# Patient Record
Sex: Female | Born: 1975 | Race: White | Hispanic: No | Marital: Single | Smoking: Never smoker
Health system: Southern US, Community
[De-identification: ages and names within clinical notes are randomized; demographics above are authoritative.]

## PROBLEM LIST (undated history)

## (undated) DIAGNOSIS — J329 Chronic sinusitis, unspecified: Secondary | ICD-10-CM

## (undated) DIAGNOSIS — B029 Zoster without complications: Secondary | ICD-10-CM

## (undated) HISTORY — PX: BREAST ENHANCEMENT SURGERY: SHX7

## (undated) HISTORY — PX: OTHER SURGICAL HISTORY: SHX169

---

## 1999-08-13 ENCOUNTER — Ambulatory Visit (HOSPITAL_COMMUNITY): Admission: RE | Admit: 1999-08-13 | Discharge: 1999-08-13 | Payer: Self-pay | Admitting: Obstetrics and Gynecology

## 1999-12-23 ENCOUNTER — Other Ambulatory Visit: Admission: RE | Admit: 1999-12-23 | Discharge: 1999-12-23 | Payer: Self-pay | Admitting: Obstetrics and Gynecology

## 2001-03-16 ENCOUNTER — Other Ambulatory Visit: Admission: RE | Admit: 2001-03-16 | Discharge: 2001-03-16 | Payer: Self-pay | Admitting: Obstetrics and Gynecology

## 2002-03-17 ENCOUNTER — Other Ambulatory Visit: Admission: RE | Admit: 2002-03-17 | Discharge: 2002-03-17 | Payer: Self-pay | Admitting: Obstetrics and Gynecology

## 2003-08-29 ENCOUNTER — Other Ambulatory Visit: Admission: RE | Admit: 2003-08-29 | Discharge: 2003-08-29 | Payer: Self-pay | Admitting: Obstetrics and Gynecology

## 2005-10-05 ENCOUNTER — Inpatient Hospital Stay (HOSPITAL_COMMUNITY): Admission: AD | Admit: 2005-10-05 | Discharge: 2005-10-09 | Payer: Self-pay | Admitting: Obstetrics and Gynecology

## 2005-10-10 ENCOUNTER — Encounter: Admission: RE | Admit: 2005-10-10 | Discharge: 2005-11-06 | Payer: Self-pay | Admitting: Obstetrics and Gynecology

## 2005-11-07 ENCOUNTER — Encounter: Admission: RE | Admit: 2005-11-07 | Discharge: 2005-12-07 | Payer: Self-pay | Admitting: Obstetrics and Gynecology

## 2005-12-08 ENCOUNTER — Encounter: Admission: RE | Admit: 2005-12-08 | Discharge: 2006-01-06 | Payer: Self-pay | Admitting: Obstetrics and Gynecology

## 2006-01-07 ENCOUNTER — Encounter: Admission: RE | Admit: 2006-01-07 | Discharge: 2006-02-06 | Payer: Self-pay | Admitting: Obstetrics and Gynecology

## 2006-02-07 ENCOUNTER — Encounter: Admission: RE | Admit: 2006-02-07 | Discharge: 2006-03-08 | Payer: Self-pay | Admitting: Obstetrics and Gynecology

## 2006-03-09 ENCOUNTER — Encounter: Admission: RE | Admit: 2006-03-09 | Discharge: 2006-04-08 | Payer: Self-pay | Admitting: Obstetrics and Gynecology

## 2006-04-09 ENCOUNTER — Encounter: Admission: RE | Admit: 2006-04-09 | Discharge: 2006-04-13 | Payer: Self-pay | Admitting: Obstetrics and Gynecology

## 2007-03-23 ENCOUNTER — Ambulatory Visit: Payer: Self-pay | Admitting: Family Medicine

## 2012-09-12 ENCOUNTER — Emergency Department (HOSPITAL_BASED_OUTPATIENT_CLINIC_OR_DEPARTMENT_OTHER)
Admission: EM | Admit: 2012-09-12 | Discharge: 2012-09-12 | Disposition: A | Discharge status: Home / Self Care | Payer: BC Managed Care – PPO | Attending: Emergency Medicine | Admitting: Emergency Medicine

## 2012-09-12 ENCOUNTER — Emergency Department (HOSPITAL_BASED_OUTPATIENT_CLINIC_OR_DEPARTMENT_OTHER): Payer: BC Managed Care – PPO

## 2012-09-12 ENCOUNTER — Encounter (HOSPITAL_BASED_OUTPATIENT_CLINIC_OR_DEPARTMENT_OTHER): Payer: Self-pay

## 2012-09-12 DIAGNOSIS — R51 Headache: Secondary | ICD-10-CM | POA: Insufficient documentation

## 2012-09-12 DIAGNOSIS — Z792 Long term (current) use of antibiotics: Secondary | ICD-10-CM | POA: Insufficient documentation

## 2012-09-12 DIAGNOSIS — H9209 Otalgia, unspecified ear: Secondary | ICD-10-CM

## 2012-09-12 DIAGNOSIS — J329 Chronic sinusitis, unspecified: Secondary | ICD-10-CM | POA: Insufficient documentation

## 2012-09-12 DIAGNOSIS — Z79899 Other long term (current) drug therapy: Secondary | ICD-10-CM | POA: Insufficient documentation

## 2012-09-12 DIAGNOSIS — R1110 Vomiting, unspecified: Secondary | ICD-10-CM | POA: Insufficient documentation

## 2012-09-12 DIAGNOSIS — F17200 Nicotine dependence, unspecified, uncomplicated: Secondary | ICD-10-CM | POA: Insufficient documentation

## 2012-09-12 DIAGNOSIS — Z3202 Encounter for pregnancy test, result negative: Secondary | ICD-10-CM | POA: Insufficient documentation

## 2012-09-12 HISTORY — DX: Chronic sinusitis, unspecified: J32.9

## 2012-09-12 LAB — PREGNANCY, URINE: Preg Test, Ur: NEGATIVE

## 2012-09-12 MED ORDER — METOCLOPRAMIDE HCL 5 MG/ML IJ SOLN
10.0000 mg | Freq: Once | INTRAMUSCULAR | Status: AC
  Administered 2012-09-12: 10 mg via INTRAMUSCULAR
  Filled 2012-09-12: qty 2

## 2012-09-12 MED ORDER — NAPROXEN 375 MG PO TABS: 375.0000 mg | ORAL_TABLET | Freq: Two times a day (BID) | ORAL | Status: AC

## 2012-09-12 MED ORDER — ANTIPYRINE-BENZOCAINE 5.4-1.4 % OT SOLN: 3.0000 [drp] | Freq: Four times a day (QID) | OTIC | Status: AC | PRN

## 2012-09-12 MED ORDER — DIPHENHYDRAMINE HCL 50 MG/ML IJ SOLN
12.5000 mg | Freq: Once | INTRAMUSCULAR | Status: AC
  Administered 2012-09-12: 12.5 mg via INTRAMUSCULAR
  Filled 2012-09-12: qty 1

## 2012-09-12 MED ORDER — KETOROLAC TROMETHAMINE 60 MG/2ML IM SOLN
60.0000 mg | Freq: Once | INTRAMUSCULAR | Status: AC
  Administered 2012-09-12: 60 mg via INTRAMUSCULAR
  Filled 2012-09-12: qty 2

## 2012-09-12 NOTE — ED Notes (Signed)
Pt back from CT

## 2012-09-12 NOTE — ED Notes (Signed)
Patient here with ongoing frontal headache that is worse tonight. Has been taking 2 different antibiotics and steroid for sinus infection. Reports vomiting x 1 tonight due to the pain. Taking amoxicillin now

## 2012-09-12 NOTE — ED Provider Notes (Signed)
History     CSN: [id number]  Arrival date & time 09/12/12  0148   First MD Initiated Contact with Patient 09/12/12 [phone]      Chief Complaint  Patient presents with  . frontal headache, earache     (Consider location/radiation/quality/duration/timing/severity/associated sxs/prior treatment) Patient is a 37 y.o. female presenting with ear pain and headaches. The history is provided by the patient. No language interpreter was used.  Otalgia This is a new problem. The current episode started more than 1 week ago. There is pain in both ears. The problem occurs constantly. The problem has not changed since onset.There has been no fever. The pain is severe. Associated symptoms include headaches. Pertinent negatives include no ear discharge, no hearing loss, no rhinorrhea, no sore throat, no neck pain, no cough and no rash. Her past medical history does not include chronic ear infection.  Headache  This is a new problem. The current episode started 6 to 12 hours ago. The problem occurs constantly. The problem has not changed since onset.The headache is associated with nothing. The pain is located in the frontal region. The quality of the pain is described as dull. The pain does not radiate. Pertinent negatives include no anorexia, no fever, no malaise/fatigue, no chest pressure and no shortness of breath. She has tried nothing for the symptoms. The treatment provided no relief.  No f/c/r.  No changes in vision no changes in cognition  Past Medical History  Diagnosis Date  . Sinusitis     History reviewed. No pertinent past surgical history.  No family history on file.  History  Substance Use Topics  . Smoking status: Current Every Day Smoker -- 0.5 packs/day    Types: Cigarettes  . Smokeless tobacco: Not on file  . Alcohol Use:     OB History    Grav Para Term Preterm Abortions TAB SAB Ect Mult Living                  Review of Systems  Constitutional: Negative for fever and  malaise/fatigue.  HENT: Positive for ear pain and sinus pressure. Negative for hearing loss, sore throat, rhinorrhea, neck pain and ear discharge.   Respiratory: Negative for cough and shortness of breath.   Gastrointestinal: Negative for anorexia.  Skin: Negative for rash.  Neurological: Positive for headaches.  All other systems reviewed and are negative.    Allergies  Review of patient's allergies indicates no known allergies.  Home Medications   Current Outpatient Rx  Name  Route  Sig  Dispense  Refill  . AMOXICILLIN 875 MG PO TABS   Oral   Take 875 mg by mouth 2 (two) times daily.         . AMPHETAMINE-DEXTROAMPHETAMINE 20 MG PO TABS   Oral   Take 20 mg by mouth 2 (two) times daily.           BP 153/99  Pulse 94  Temp 97.5 F (36.4 C) (Oral)  Resp 24  Ht 5' 5.5" (1.664 m)  Wt 168 lb (76.204 kg)  BMI 27.53 kg/m2  SpO2 98%  Physical Exam  Constitutional: She is oriented to person, place, and time. She appears well-developed and well-nourished. No distress.  HENT:  Head: Normocephalic and atraumatic.  Right Ear: No mastoid tenderness. Tympanic membrane is not injected, not erythematous and not bulging.  Left Ear: No mastoid tenderness. Tympanic membrane is not injected, not erythematous and not bulging.  Nose: Mucosal edema present.  Mouth/Throat: No oropharyngeal  exudate.       Posterior oropharyngeal cobblestoning and clear drainage consistent with post nasal drip  No temporal tenderness  Eyes: Conjunctivae normal and EOM are normal. Pupils are equal, round, and reactive to light.  Neck: Normal range of motion. Neck supple. No tracheal deviation present.       No meningeal signs  Cardiovascular: Normal rate and regular rhythm.   Pulmonary/Chest: Effort normal and breath sounds normal. She has no wheezes. She has no rales.  Abdominal: Soft. Bowel sounds are normal. There is no tenderness. There is no rebound and no guarding.  Musculoskeletal: Normal range  of motion.  Lymphadenopathy:    She has no cervical adenopathy.  Neurological: She is alert and oriented to person, place, and time. She has normal reflexes. No cranial nerve deficit.  Skin: Skin is warm and dry.  Psychiatric: Her mood appears anxious.    ED Course  Procedures (including critical care time)   Labs Reviewed  PREGNANCY, URINE   Ct Head Wo Contrast  09/12/2012  *RADIOLOGY REPORT*  Clinical Data: Headache  CT HEAD WITHOUT CONTRAST  Technique:  Contiguous axial images were obtained from the base of the skull through the vertex without contrast.  Comparison: None.  Findings: There is no evidence for acute hemorrhage, hydrocephalus, mass lesion, or abnormal extra-axial fluid collection.  No definite CT evidence for acute infarction.  The visualized paranasal sinuses and mastoid air cells are predominately clear.  IMPRESSION: No acute intracranial abnormality.   Original Report Authenticated By: Jearld Lesch, M.D.      No diagnosis found.    MDM  Complete antibiotics.  Will prescribe ear drops.  Return for fever, stiff neck, changes in thinking, speech, weakness numbness or any concerns       Oceane Fosse K Quron Ruddy-Rasch, MD 09/12/12 [phone]

## 2015-02-07 ENCOUNTER — Emergency Department (HOSPITAL_BASED_OUTPATIENT_CLINIC_OR_DEPARTMENT_OTHER): Payer: BLUE CROSS/BLUE SHIELD

## 2015-02-07 ENCOUNTER — Encounter (HOSPITAL_BASED_OUTPATIENT_CLINIC_OR_DEPARTMENT_OTHER): Payer: Self-pay

## 2015-02-07 ENCOUNTER — Emergency Department (HOSPITAL_BASED_OUTPATIENT_CLINIC_OR_DEPARTMENT_OTHER)
Admission: EM | Admit: 2015-02-07 | Discharge: 2015-02-07 | Disposition: A | Discharge status: Home / Self Care | Payer: BLUE CROSS/BLUE SHIELD | Attending: Emergency Medicine | Admitting: Emergency Medicine

## 2015-02-07 DIAGNOSIS — Z79899 Other long term (current) drug therapy: Secondary | ICD-10-CM | POA: Insufficient documentation

## 2015-02-07 DIAGNOSIS — Z792 Long term (current) use of antibiotics: Secondary | ICD-10-CM | POA: Insufficient documentation

## 2015-02-07 DIAGNOSIS — R110 Nausea: Secondary | ICD-10-CM | POA: Diagnosis not present

## 2015-02-07 DIAGNOSIS — H538 Other visual disturbances: Secondary | ICD-10-CM | POA: Insufficient documentation

## 2015-02-07 DIAGNOSIS — Z8709 Personal history of other diseases of the respiratory system: Secondary | ICD-10-CM | POA: Diagnosis not present

## 2015-02-07 DIAGNOSIS — R51 Headache: Secondary | ICD-10-CM | POA: Insufficient documentation

## 2015-02-07 DIAGNOSIS — Z791 Long term (current) use of non-steroidal anti-inflammatories (NSAID): Secondary | ICD-10-CM | POA: Diagnosis not present

## 2015-02-07 DIAGNOSIS — R519 Headache, unspecified: Secondary | ICD-10-CM

## 2015-02-07 DIAGNOSIS — Z8619 Personal history of other infectious and parasitic diseases: Secondary | ICD-10-CM | POA: Insufficient documentation

## 2015-02-07 HISTORY — DX: Zoster without complications: B02.9

## 2015-02-07 MED ORDER — METOCLOPRAMIDE HCL 5 MG/ML IJ SOLN
10.0000 mg | Freq: Once | INTRAMUSCULAR | Status: AC
  Administered 2015-02-07: 10 mg via INTRAVENOUS
  Filled 2015-02-07: qty 2

## 2015-02-07 MED ORDER — DIPHENHYDRAMINE HCL 50 MG/ML IJ SOLN
25.0000 mg | Freq: Once | INTRAMUSCULAR | Status: AC
  Administered 2015-02-07: 25 mg via INTRAVENOUS
  Filled 2015-02-07: qty 1

## 2015-02-07 MED ORDER — SODIUM CHLORIDE 0.9 % IV BOLUS (SEPSIS)
1000.0000 mL | Freq: Once | INTRAVENOUS | Status: AC
  Administered 2015-02-07: 1000 mL via INTRAVENOUS

## 2015-02-07 MED ORDER — KETOROLAC TROMETHAMINE 30 MG/ML IJ SOLN
30.0000 mg | Freq: Once | INTRAMUSCULAR | Status: AC
  Administered 2015-02-07: 30 mg via INTRAVENOUS
  Filled 2015-02-07: qty 1

## 2015-02-07 NOTE — ED Notes (Signed)
PIV insert by RRT

## 2015-02-07 NOTE — ED Provider Notes (Signed)
CSN: [id number]     Arrival date & time 02/07/15  1694 History   First MD Initiated Contact with Patient 02/07/15 [phone]     Chief Complaint  Patient presents with  . Headache     (Consider location/radiation/quality/duration/timing/severity/associated sxs/prior Treatment) HPI  This is a 39 year old female with a one-month history of headaches. The headaches are episodic and typically occur at night. They occur almost every night. They are located in the frontal area. They usually improve one hour after taking ibuprofen. She developed a headache last night about 9 PM which did not improve with ibuprofen. She describes the pain as severe and feels like there is a hot poker above her left eye. She was seen for this in [place] almost a month ago and had sinus x-rays which were reportedly unremarkable. She was seen 5 days ago for a rash on her right lower back and was diagnosed with shingles for which she is taking valacyclovir and gabapentin. She complains of blurred vision in her left eye and nausea as associated symptoms. There is no focal neurologic deficit. There is no photophobia.  Past Medical History  Diagnosis Date  . Sinusitis   . Shingles    Past Surgical History  Procedure Laterality Date  . Breast enhancement surgery    . Tummy tuck    . Cesarean section     No family history on file. History  Substance Use Topics  . Smoking status: Never Smoker   . Smokeless tobacco: Not on file  . Alcohol Use: Yes   OB History    No data available     Review of Systems  All other systems reviewed and are negative.   Allergies  Review of patient's allergies indicates no known allergies.  Home Medications   Prior to Admission medications   Medication Sig Start Date End Date Taking? Authorizing Provider  amoxicillin (AMOXIL) 875 MG tablet Take 875 mg by mouth 2 (two) times daily.    Historical Provider, MD  amphetamine-dextroamphetamine (ADDERALL) 20 MG tablet Take 20 mg by mouth  2 (two) times daily.    Historical Provider, MD  antipyrine-benzocaine Lyla Son) otic solution Place 3 drops in ear(s) 4 (four) times daily as needed for pain. 09/12/12   April Palumbo, MD  naproxen (NAPROSYN) 375 MG tablet Take 1 tablet (375 mg total) by mouth 2 (two) times daily. 09/12/12   April Palumbo, MD   BP 140/92 mmHg  Pulse 70  Temp(Src) 97.9 F (36.6 C) (Oral)  Resp 20  Ht 5\' 5"  (1.651 m)  Wt 164 lb (74.39 kg)  BMI 27.29 kg/m2  SpO2 98%   Physical Exam  General: Well-developed, well-nourished female in no acute distress; appearance consistent with age of record HENT: normocephalic; atraumatic Eyes: pupils equal, round and reactive to light; extraocular muscles intact; left conjunctival injection Neck: supple Heart: regular rate and rhythm Lungs: clear to auscultation bilaterally Abdomen: soft; nondistended Extremities: No deformity; full range of motion; pulses normal Neurologic: Awake, alert and oriented; motor function intact in all extremities and symmetric; no facial droop Skin: Warm and dry; crusted lesions right lower back consistent with healing shingles Psychiatric: Tearful    ED Course  Procedures (including critical care time)   MDM  Nursing notes and vitals signs, including pulse oximetry, reviewed.  Summary of this visit's results, reviewed by myself:  Imaging Studies: Ct Head Wo Contrast  02/07/2015   CLINICAL DATA:  Initial evaluation for 4 week history of severe headache with nausea.  EXAM:  CT HEAD WITHOUT CONTRAST  TECHNIQUE: Contiguous axial images were obtained from the base of the skull through the vertex without intravenous contrast.  COMPARISON:  Prior study from 09/12/2012.  FINDINGS: There is no acute intracranial hemorrhage or infarct. No mass lesion or midline shift. Gray-white matter differentiation is well maintained. Ventricles are normal in size without evidence of hydrocephalus. CSF containing spaces are within normal limits. No  extra-axial fluid collection.  The calvarium is intact.  Orbital soft tissues are within normal limits.  Scattered mucosal thickening present within the ethmoidal sinuses, with small amount of layering opacity within the frontoethmoidal recesses bilaterally. Maxillary and sphenoid sinuses are well pneumatized. No osseous dehiscence. No air-fluid level to suggest active sinus infection. Mastoid air cells are clear.  Scalp soft tissues are unremarkable.  IMPRESSION: 1. No acute intracranial process identified. 2. Mild ethmoidal sinus disease as above.   Electronically Signed   By: Rise Mu M.D.   On: 02/07/2015 07:11      Paula Libra, MD 02/07/15 [phone]

## 2015-02-07 NOTE — ED Notes (Signed)
Pt c/o severe headache x4wks with nausea, states dx with shingles to back on saturday

## 2015-02-07 NOTE — Discharge Instructions (Signed)
Continue ibuprofen and Tylenol as needed for headache.  If your headaches persist, follow-up with your primary Dr. to discuss further treatment or referral to a neurologist.   General Headache Without Cause A general headache is pain or discomfort felt around the head or neck area. The cause may not be found.  HOME CARE   Keep all doctor visits.  Only take medicines as told by your doctor.  Lie down in a dark, quiet room when you have a headache.  Keep a journal to find out if certain things bring on headaches. For example, write down:  What you eat and drink.  How much sleep you get.  Any change to your diet or medicines.  Relax by getting a massage or doing other relaxing activities.  Put ice or heat packs on the head and neck area as told by your doctor.  Lessen stress.  Sit up straight. Do not tighten (tense) your muscles.  Quit smoking if you smoke.  Lessen how much alcohol you drink.  Lessen how much caffeine you drink, or stop drinking caffeine.  Eat and sleep on a regular schedule.  Get 7 to 9 hours of sleep, or as told by your doctor.  Keep lights dim if bright lights bother you or make your headaches worse. GET HELP RIGHT AWAY IF:   Your headache becomes really bad.  You have a fever.  You have a stiff neck.  You have trouble seeing.  Your muscles are weak, or you lose muscle control.  You lose your balance or have trouble walking.  You feel like you will pass out (faint), or you pass out.  You have really bad symptoms that are different than your first symptoms.  You have problems with the medicines given to you by your doctor.  Your medicines do not work.  Your headache feels different than the other headaches.  You feel sick to your stomach (nauseous) or throw up (vomit). MAKE SURE YOU:   Understand these instructions.  Will watch your condition.  Will get help right away if you are not doing well or get worse. Document Released:  05/12/2008 Document Revised: 10/26/2011 Document Reviewed: 07/24/2011 [facility] Patient Information 2015 [place], [place]. This information is not intended to replace advice given to you by your health care provider. Make sure you discuss any questions you have with your health care provider.

## 2015-02-07 NOTE — Progress Notes (Signed)
Placed patient on 100% non rebreather per MD

## 2015-02-07 NOTE — ED Provider Notes (Signed)
Care assumed from Dr. Read Drivers at shift change. Patient presents here with complaints of headache. Neurologic exam is nonfocal and CT scan of the head is negative. She is feeling better after IV medications and fluids and I believe is appropriate for discharge. She is to follow-up with her primary Dr. She has had an increased frequency of headaches over the past month. If this continues she may require referral to neurology. This can be arranged in a nonemergent outpatient fashion for her primary doctor.  Geoffery Lyons, MD 02/07/15 [phone]

## 2015-06-04 ENCOUNTER — Other Ambulatory Visit: Payer: Self-pay | Admitting: Obstetrics and Gynecology

## 2015-06-04 DIAGNOSIS — R928 Other abnormal and inconclusive findings on diagnostic imaging of breast: Secondary | ICD-10-CM

## 2015-06-07 ENCOUNTER — Ambulatory Visit
Admission: RE | Admit: 2015-06-07 | Discharge: 2015-06-07 | Disposition: A | Discharge status: Home / Self Care | Payer: BLUE CROSS/BLUE SHIELD | Source: Ambulatory Visit | Attending: Obstetrics and Gynecology | Admitting: Obstetrics and Gynecology

## 2015-06-07 DIAGNOSIS — R928 Other abnormal and inconclusive findings on diagnostic imaging of breast: Secondary | ICD-10-CM

## 2016-06-06 IMAGING — MG MM DIAG BREAST TOMO UNI RIGHT
6 series · 6 of 18 positions shown · non-contrast
Comparison: Baseline screening exam dated 05/30/2015.

CLINICAL DATA: Screening recall for a right breast asymmetry.

EXAM:
DIGITAL DIAGNOSTIC RIGHT MAMMOGRAM WITH 3D TOMOSYNTHESIS AND CAD

[R ML]
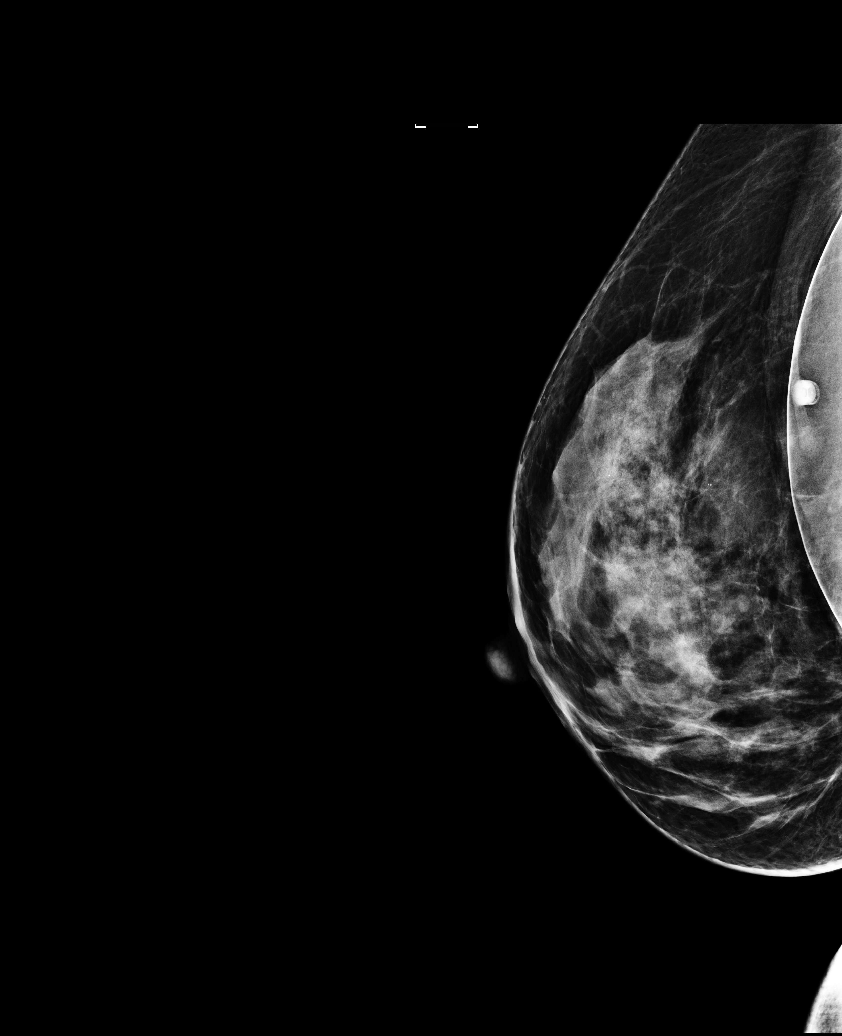

[R MLO (1 of 2)]
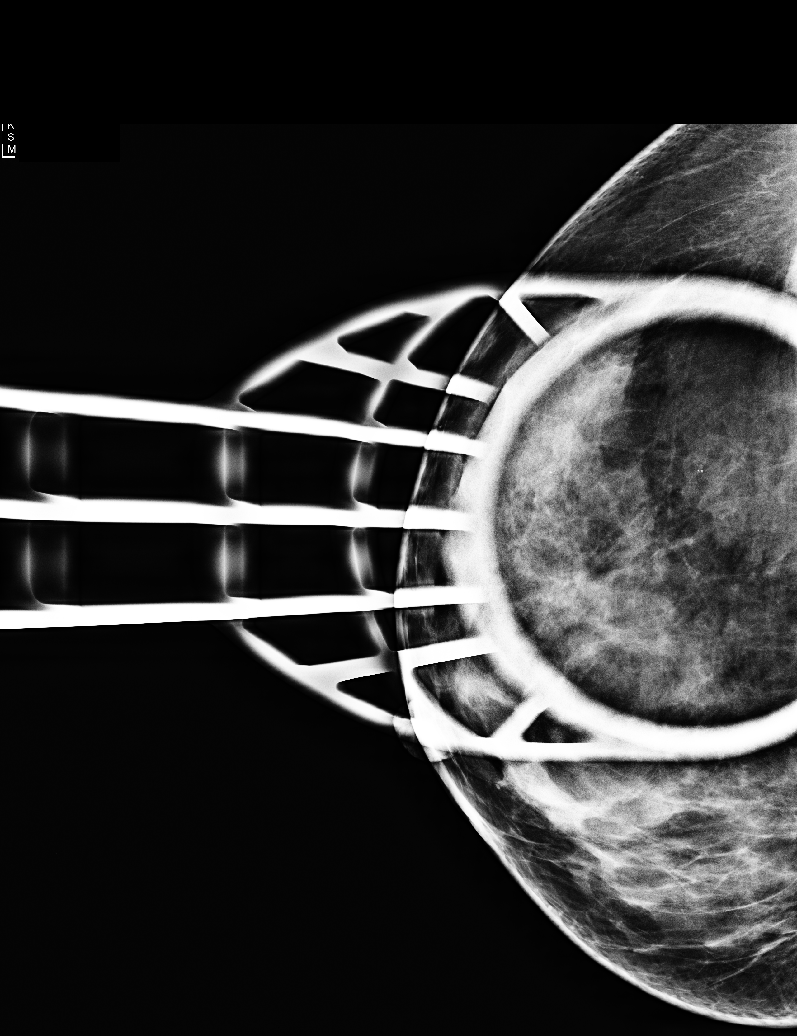

[R MLO (2 of 2)]
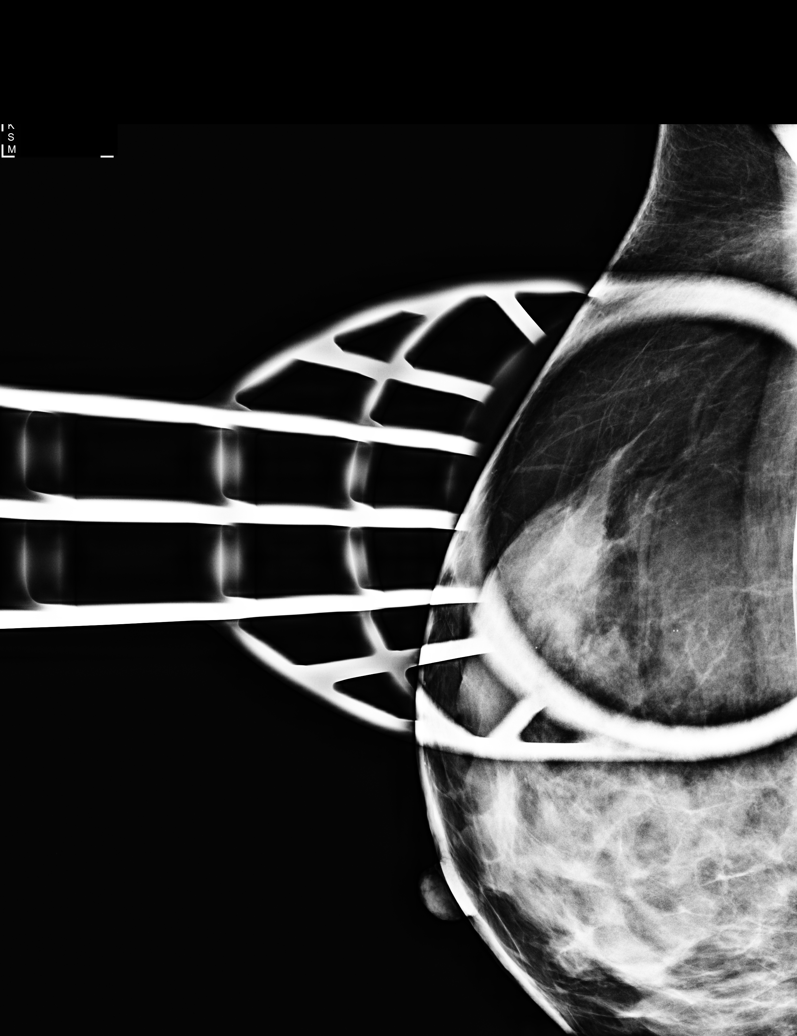

[R ML tomo · tomo slice 34/67.0]
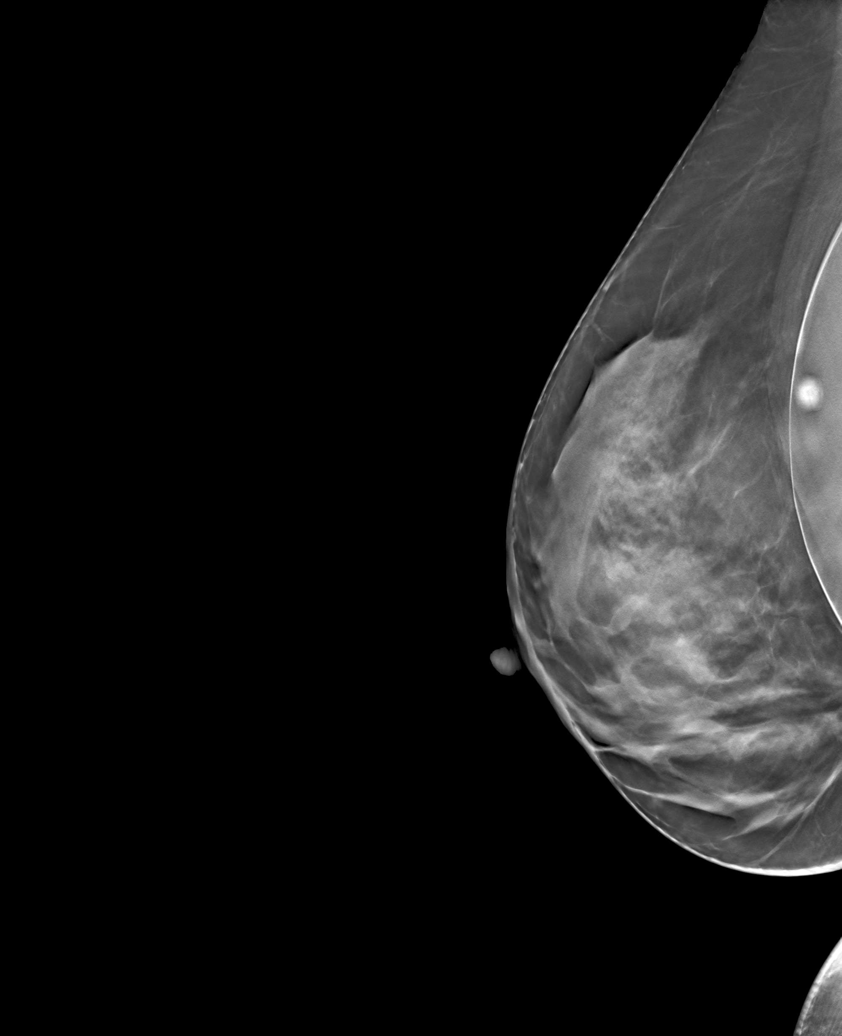

[R MLO tomo (1 of 2) · tomo slice 29/56.0]
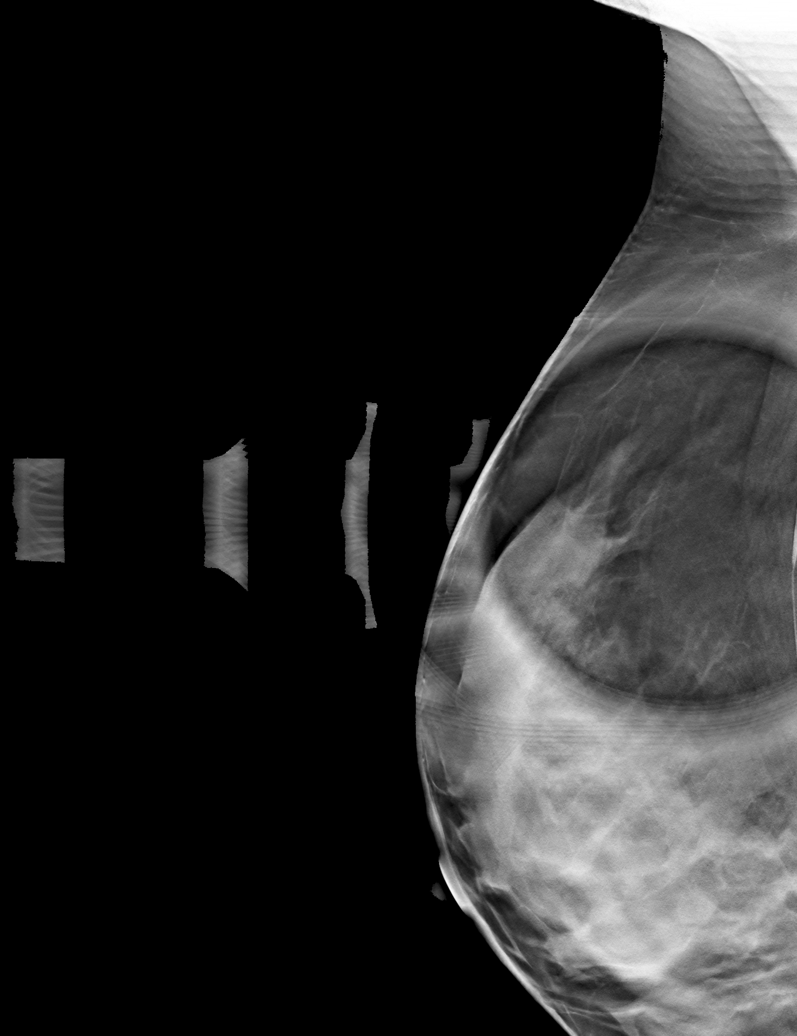

[R MLO tomo (2 of 2) · tomo slice 27/54.0]
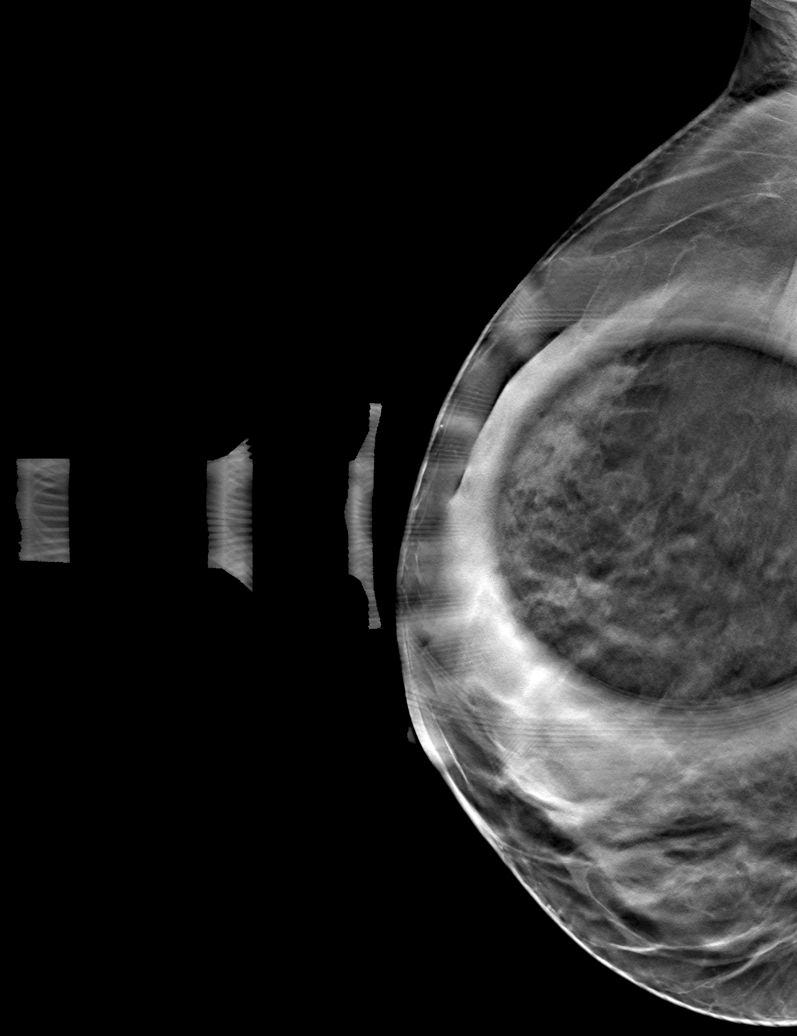

[6 of 18 positions shown; findings below may reference images not displayed]

ACR Breast Density Category c: The breast tissue is heterogeneously
dense, which may obscure small masses.
FINDINGS: ML tomosynthesis as well as spot compression MLO tomosynthesis was
performed of the right breast. The initially questioned right breast
asymmetry resolves on the additional imaging with findings
compatible with overlapping tissue. There is no mammographic
evidence of malignancy in the right breast.

Mammographic images were processed with CAD.
IMPRESSION: Initially questioned right breast asymmetry resolves on the
additional imaging with findings compatible with overlapping tissue.
There is no mammographic evidence of malignancy in the right breast.

RECOMMENDATION:
Screening mammogram in one year.(Code:84-M-KWD)

I have discussed the findings and recommendations with the patient.
Results were also provided in writing at the conclusion of the
visit. If applicable, a reminder letter will be sent to the patient
regarding the next appointment.

BI-RADS CATEGORY  1: Negative.

## 2017-09-28 DIAGNOSIS — F900 Attention-deficit hyperactivity disorder, predominantly inattentive type: Secondary | ICD-10-CM | POA: Diagnosis not present

## 2017-11-30 DIAGNOSIS — R87810 Cervical high risk human papillomavirus (HPV) DNA test positive: Secondary | ICD-10-CM | POA: Diagnosis not present

## 2017-11-30 DIAGNOSIS — Z1151 Encounter for screening for human papillomavirus (HPV): Secondary | ICD-10-CM | POA: Diagnosis not present

## 2017-11-30 DIAGNOSIS — Z01419 Encounter for gynecological examination (general) (routine) without abnormal findings: Secondary | ICD-10-CM | POA: Diagnosis not present

## 2017-11-30 DIAGNOSIS — Z6829 Body mass index (BMI) 29.0-29.9, adult: Secondary | ICD-10-CM | POA: Diagnosis not present

## 2017-12-02 DIAGNOSIS — Z1231 Encounter for screening mammogram for malignant neoplasm of breast: Secondary | ICD-10-CM | POA: Diagnosis not present

## 2018-03-22 DIAGNOSIS — F900 Attention-deficit hyperactivity disorder, predominantly inattentive type: Secondary | ICD-10-CM | POA: Diagnosis not present

## 2018-09-13 DIAGNOSIS — F900 Attention-deficit hyperactivity disorder, predominantly inattentive type: Secondary | ICD-10-CM | POA: Diagnosis not present

## 2019-03-07 DIAGNOSIS — F900 Attention-deficit hyperactivity disorder, predominantly inattentive type: Secondary | ICD-10-CM | POA: Diagnosis not present

## 2019-08-19 DIAGNOSIS — H9202 Otalgia, left ear: Secondary | ICD-10-CM | POA: Diagnosis not present

## 2019-08-19 DIAGNOSIS — J329 Chronic sinusitis, unspecified: Secondary | ICD-10-CM | POA: Diagnosis not present

## 2019-08-24 DIAGNOSIS — R519 Headache, unspecified: Secondary | ICD-10-CM | POA: Diagnosis not present

## 2019-08-24 DIAGNOSIS — F17200 Nicotine dependence, unspecified, uncomplicated: Secondary | ICD-10-CM | POA: Diagnosis not present

## 2019-09-04 DIAGNOSIS — F900 Attention-deficit hyperactivity disorder, predominantly inattentive type: Secondary | ICD-10-CM | POA: Diagnosis not present

## 2019-09-22 DIAGNOSIS — H168 Other keratitis: Secondary | ICD-10-CM | POA: Diagnosis not present

## 2019-09-27 DIAGNOSIS — H168 Other keratitis: Secondary | ICD-10-CM | POA: Diagnosis not present

## 2019-11-07 NOTE — Progress Notes (Signed)
Inadvertently charged to wrong patient.

## 2019-11-16 ENCOUNTER — Ambulatory Visit: Payer: Self-pay | Admitting: *Deleted

## 2020-01-24 DIAGNOSIS — Z1231 Encounter for screening mammogram for malignant neoplasm of breast: Secondary | ICD-10-CM | POA: Diagnosis not present

## 2020-01-24 DIAGNOSIS — Z6830 Body mass index (BMI) 30.0-30.9, adult: Secondary | ICD-10-CM | POA: Diagnosis not present

## 2020-01-24 DIAGNOSIS — Z01419 Encounter for gynecological examination (general) (routine) without abnormal findings: Secondary | ICD-10-CM | POA: Diagnosis not present

## 2020-02-08 DIAGNOSIS — Z30433 Encounter for removal and reinsertion of intrauterine contraceptive device: Secondary | ICD-10-CM | POA: Diagnosis not present

## 2020-02-28 DIAGNOSIS — Z30431 Encounter for routine checking of intrauterine contraceptive device: Secondary | ICD-10-CM | POA: Diagnosis not present

## 2020-02-28 DIAGNOSIS — F900 Attention-deficit hyperactivity disorder, predominantly inattentive type: Secondary | ICD-10-CM | POA: Diagnosis not present

## 2020-05-14 ENCOUNTER — Ambulatory Visit: Payer: Self-pay | Admitting: *Deleted

## 2020-05-14 NOTE — Progress Notes (Signed)
Err  r

## 2020-12-26 ENCOUNTER — Ambulatory Visit: Payer: Self-pay | Admitting: *Deleted

## 2021-02-04 ENCOUNTER — Ambulatory Visit: Payer: Self-pay | Admitting: *Deleted

## 2021-02-25 ENCOUNTER — Ambulatory Visit: Payer: Self-pay | Admitting: *Deleted
